# Patient Record
Sex: Female | Born: 1955 | Race: White | Hispanic: No | Marital: Single
Health system: Midwestern US, Academic
[De-identification: ages and names within clinical notes are randomized; demographics above are authoritative.]

---

## 2018-06-30 ENCOUNTER — Encounter: Admit: 2018-06-30 | Discharge: 2018-06-30 | Payer: MEDICARE

## 2018-06-30 DIAGNOSIS — I4891 Unspecified atrial fibrillation: Principal | ICD-10-CM

## 2018-11-17 ENCOUNTER — Encounter: Admit: 2018-11-17 | Discharge: 2018-11-17 | Payer: MEDICARE

## 2018-11-17 NOTE — Progress Notes
RECORDS REQUEST     Please send the following records for continuation of care:     Audrey Aguilar  Jun 12, 1956     Office Notes  EKG's with tracings   Recent Cardiac Testing  Any Cardiac-related records  Recent lab  Cardiac Procedures  H&P/Discharge Summary  Operative Reports- Cardiac     Please fax to:  Desmond Lope [phone]     [facility]  Cardiovascular Medicine

## 2018-11-21 ENCOUNTER — Encounter: Admit: 2018-11-21 | Discharge: 2018-11-21 | Payer: MEDICARE

## 2018-11-21 DIAGNOSIS — I4891 Unspecified atrial fibrillation: ICD-10-CM

## 2018-11-21 DIAGNOSIS — I2510 Atherosclerotic heart disease of native coronary artery without angina pectoris: Principal | ICD-10-CM

## 2018-11-21 DIAGNOSIS — I499 Cardiac arrhythmia, unspecified: ICD-10-CM

## 2018-11-21 DIAGNOSIS — E119 Type 2 diabetes mellitus without complications: ICD-10-CM

## 2018-11-21 DIAGNOSIS — J449 Chronic obstructive pulmonary disease, unspecified: ICD-10-CM

## 2018-11-21 DIAGNOSIS — E785 Hyperlipidemia, unspecified: ICD-10-CM

## 2018-11-21 DIAGNOSIS — I10 Essential (primary) hypertension: ICD-10-CM

## 2018-11-22 NOTE — Progress Notes
Request for the following medical records for purpose of continuity of care:   Has an appointment with RAD on 03/10    Please send most recent OV note and labs.    Please Fax to:  [facility] - [phone]  Dr. Wallene Huh  Attention:  Valentina Gu, RN    Thank you

## 2018-12-15 ENCOUNTER — Encounter: Admit: 2018-12-15 | Discharge: 2018-12-15 | Payer: MEDICARE

## 2018-12-16 ENCOUNTER — Encounter: Admit: 2018-12-16 | Discharge: 2018-12-16 | Payer: MEDICARE

## 2018-12-19 ENCOUNTER — Encounter: Admit: 2018-12-19 | Discharge: 2018-12-19 | Payer: MEDICARE

## 2018-12-20 ENCOUNTER — Encounter: Admit: 2018-12-20 | Discharge: 2018-12-20 | Payer: MEDICARE

## 2018-12-20 DIAGNOSIS — I4891 Unspecified atrial fibrillation: ICD-10-CM

## 2018-12-20 DIAGNOSIS — E785 Hyperlipidemia, unspecified: ICD-10-CM

## 2018-12-20 DIAGNOSIS — I2510 Atherosclerotic heart disease of native coronary artery without angina pectoris: Principal | ICD-10-CM

## 2018-12-20 DIAGNOSIS — I10 Essential (primary) hypertension: ICD-10-CM

## 2018-12-20 DIAGNOSIS — J449 Chronic obstructive pulmonary disease, unspecified: ICD-10-CM

## 2018-12-20 DIAGNOSIS — E119 Type 2 diabetes mellitus without complications: ICD-10-CM

## 2018-12-20 DIAGNOSIS — I499 Cardiac arrhythmia, unspecified: ICD-10-CM

## 2018-12-23 ENCOUNTER — Ambulatory Visit: Admit: 2018-12-23 | Discharge: 2018-12-24 | Payer: MEDICARE

## 2018-12-23 ENCOUNTER — Encounter: Admit: 2018-12-23 | Discharge: 2018-12-23 | Payer: MEDICARE

## 2018-12-23 DIAGNOSIS — E119 Type 2 diabetes mellitus without complications: ICD-10-CM

## 2018-12-23 DIAGNOSIS — E785 Hyperlipidemia, unspecified: ICD-10-CM

## 2018-12-23 DIAGNOSIS — I10 Essential (primary) hypertension: ICD-10-CM

## 2018-12-23 DIAGNOSIS — I2510 Atherosclerotic heart disease of native coronary artery without angina pectoris: Principal | ICD-10-CM

## 2018-12-23 DIAGNOSIS — I499 Cardiac arrhythmia, unspecified: ICD-10-CM

## 2018-12-23 DIAGNOSIS — I4891 Unspecified atrial fibrillation: ICD-10-CM

## 2018-12-23 DIAGNOSIS — J449 Chronic obstructive pulmonary disease, unspecified: ICD-10-CM

## 2018-12-24 DIAGNOSIS — I4891 Unspecified atrial fibrillation: Principal | ICD-10-CM

## 2018-12-24 DIAGNOSIS — I10 Essential (primary) hypertension: ICD-10-CM

## 2018-12-24 DIAGNOSIS — I2510 Atherosclerotic heart disease of native coronary artery without angina pectoris: ICD-10-CM

## 2018-12-24 DIAGNOSIS — R0602 Shortness of breath: ICD-10-CM

## 2019-04-03 ENCOUNTER — Encounter: Admit: 2019-04-03 | Discharge: 2019-04-03

## 2019-04-03 NOTE — Progress Notes
Audrey Aguilar. Woess[place], 1956/05/31, has an appointment with Dr. Arrie Eastern on 04/07/19.    Please send recent lab results for continuity of care.    Thank you,   Jezreel Justiniano    Phone: [phone]  Fax: [phone]

## 2019-04-07 ENCOUNTER — Encounter: Admit: 2019-04-07 | Discharge: 2019-04-07

## 2019-04-07 ENCOUNTER — Ambulatory Visit: Admit: 2019-04-07 | Discharge: 2019-04-07

## 2019-04-07 DIAGNOSIS — I4891 Unspecified atrial fibrillation: Secondary | ICD-10-CM

## 2019-04-07 DIAGNOSIS — I10 Essential (primary) hypertension: Secondary | ICD-10-CM

## 2019-04-07 DIAGNOSIS — I2510 Atherosclerotic heart disease of native coronary artery without angina pectoris: Secondary | ICD-10-CM

## 2019-04-07 DIAGNOSIS — E119 Type 2 diabetes mellitus without complications: Secondary | ICD-10-CM

## 2019-04-07 DIAGNOSIS — I499 Cardiac arrhythmia, unspecified: Secondary | ICD-10-CM

## 2019-04-07 DIAGNOSIS — R0602 Shortness of breath: Secondary | ICD-10-CM

## 2019-04-07 DIAGNOSIS — J449 Chronic obstructive pulmonary disease, unspecified: Secondary | ICD-10-CM

## 2019-04-07 DIAGNOSIS — E785 Hyperlipidemia, unspecified: Secondary | ICD-10-CM

## 2019-04-07 MED ORDER — METOPROLOL SUCCINATE 25 MG PO TB24
25 mg | ORAL_TABLET | Freq: Every day | ORAL | 3 refills | 90.00000 days | Status: DC
Start: 2019-04-07 — End: 2019-04-29

## 2019-04-07 MED ORDER — PERFLUTREN LIPID MICROSPHERES 1.1 MG/ML IV SUSP
1-20 mL | Freq: Once | INTRAVENOUS | 0 refills | Status: CP | PRN
Start: 2019-04-07 — End: ?
  Administered 2019-04-07: 19:00:00 1.5 mL via INTRAVENOUS

## 2019-04-07 NOTE — Telephone Encounter
-----   Message from [place] sent at 04/07/2019  2:32 PM CDT -----  Regarding: Tikosyn Pricing   Tikosyn 500MCG BID  30 Day: $ Prior Auth   90 Day: $ Prior Auth       Dofetilide 500MCG BID   30 Day: $3.60 Retail  90 Day: $3.60 Mail

## 2019-04-10 ENCOUNTER — Encounter: Admit: 2019-04-10 | Discharge: 2019-04-10

## 2019-04-10 NOTE — Progress Notes
8-5 spoke with Audrey Aguilar. Regarding Tikosyn Admit for 8-13 was Approved for 1 inpatient day, Hospital will need to call for any additional days AUTH# [id number] valid from 7-27 to 10-11-2019    7-27 [employer] [phone] regarding Inpatient Tikosyn Admit [id number], [place] per [place] faxed to [phone] with pending AUTH# [id number]    confirmed benefits and eligibility:  Current and active since 09-14-18, $ 0 deductible with required co-insurance of 0% to max OOP $6200, then plan will pay 100% of allowable charges.  Hospital co pay 340

## 2019-04-13 ENCOUNTER — Encounter: Admit: 2019-04-13 | Discharge: 2019-04-13

## 2019-04-20 ENCOUNTER — Encounter: Admit: 2019-04-20 | Discharge: 2019-04-20

## 2019-04-20 NOTE — Telephone Encounter
Audrey Aguilar called and states she is becoming more and more anxious thinking about procedures and being admitted for [place] admission next week  asked if Dr  Arrie Eastern would prescribe something.  Relayed we don't usually do that pre procedure as we want to evaluate/assess prior to admission  she numerous questions including if this has been approved with her [place] Medicare      read note in chart from [place] regarding the admission  concerned about driving to the hospital the morning of admission.  she is worried about traffic.  encouraged her to talk with her support about this to avoid having to drive that morning. (she states she does have someone that could bring her)  Audrey Aguilar said that her anxiety just gets the best of her sometimes as she is a smoker and knows she will not be able to smoke in the hospital    We talked about the above and she did seem better at end of call.  She also plans to follow up with her PCP tomorrow regarding her anxiety

## 2019-04-20 NOTE — Telephone Encounter
-----   Message from Louis Meckel, LPN sent at 11/13/4386  3:46 PM CDT -----  Regarding: RAD- call  VM from patient on triage line.  Michela Pitcher that she needs to talk to [place] about her stay 13 to 15.  Call her at [phone].

## 2019-04-27 ENCOUNTER — Encounter: Admit: 2019-04-27 | Discharge: 2019-04-27

## 2019-04-27 DIAGNOSIS — Z5181 Encounter for therapeutic drug level monitoring: Secondary | ICD-10-CM

## 2019-04-27 DIAGNOSIS — E785 Hyperlipidemia, unspecified: Secondary | ICD-10-CM

## 2019-04-27 DIAGNOSIS — I2510 Atherosclerotic heart disease of native coronary artery without angina pectoris: Secondary | ICD-10-CM

## 2019-04-27 DIAGNOSIS — I499 Cardiac arrhythmia, unspecified: Secondary | ICD-10-CM

## 2019-04-27 DIAGNOSIS — E119 Type 2 diabetes mellitus without complications: Secondary | ICD-10-CM

## 2019-04-27 DIAGNOSIS — I4891 Unspecified atrial fibrillation: Secondary | ICD-10-CM

## 2019-04-27 DIAGNOSIS — I10 Essential (primary) hypertension: Secondary | ICD-10-CM

## 2019-04-27 DIAGNOSIS — J449 Chronic obstructive pulmonary disease, unspecified: Secondary | ICD-10-CM

## 2019-04-27 LAB — BASIC METABOLIC PANEL
Lab: 0.7 mg/dL (ref 0.4–1.00)
Lab: 105 MMOL/L (ref >60)
Lab: 137 MMOL/L (ref 137–147)
Lab: 24 MMOL/L (ref >60)
Lab: 6 mg/dL — ABNORMAL LOW (ref 7–25)
Lab: 8 pg (ref 3–12)
Lab: 82 mg/dL (ref 70–100)
Lab: 9.3 mg/dL (ref 8.5–10.6)
Lab: >60 mL/min (ref >60)
Lab: >60 mL/min (ref >60)

## 2019-04-27 LAB — MAGNESIUM: Lab: 2 mg/dL (ref 1.6–2.6)

## 2019-04-27 LAB — CBC: Lab: 6.3 K/UL (ref 4.5–11.0)

## 2019-04-27 MED ORDER — POTASSIUM CHLORIDE 20 MEQ PO TBTQ
20 meq | Freq: Once | ORAL | 0 refills | Status: CP
Start: 2019-04-27 — End: ?
  Administered 2019-04-28: 02:00:00 20 meq via ORAL

## 2019-04-27 MED ORDER — GABAPENTIN 300 MG PO CAP
300 mg | Freq: Two times a day (BID) | ORAL | 0 refills | Status: DC
Start: 2019-04-27 — End: 2019-04-29
  Administered 2019-04-28 – 2019-04-29 (×4): 300 mg via ORAL

## 2019-04-27 MED ORDER — LOSARTAN 50 MG PO TAB
100 mg | Freq: Every day | ORAL | 0 refills | Status: DC
Start: 2019-04-27 — End: 2019-04-29
  Administered 2019-04-28 – 2019-04-29 (×2): 100 mg via ORAL

## 2019-04-27 MED ORDER — POTASSIUM CHLORIDE 20 MEQ PO TBTQ
40 meq | Freq: Once | ORAL | 0 refills | Status: CP
Start: 2019-04-27 — End: ?
  Administered 2019-04-27: 21:00:00 40 meq via ORAL

## 2019-04-27 MED ORDER — METOPROLOL SUCCINATE 25 MG PO TB24
25 mg | Freq: Every day | ORAL | 0 refills | Status: DC
Start: 2019-04-27 — End: 2019-04-28
  Administered 2019-04-28: 15:00:00 25 mg via ORAL

## 2019-04-27 MED ORDER — HELP MEDICATION
Freq: Every day | 0 refills | Status: DC
Start: 2019-04-27 — End: 2019-04-27

## 2019-04-27 MED ORDER — AMLODIPINE 5 MG PO TAB
5 mg | Freq: Every day | ORAL | 0 refills | Status: DC
Start: 2019-04-27 — End: 2019-04-29
  Administered 2019-04-28 – 2019-04-29 (×2): 5 mg via ORAL

## 2019-04-27 MED ORDER — ALBUTEROL SULFATE 90 MCG/ACTUATION IN HFAA
2 | RESPIRATORY_TRACT | 0 refills | Status: DC | PRN
Start: 2019-04-27 — End: 2019-04-29
  Administered 2019-04-28: 02:00:00 2 via RESPIRATORY_TRACT

## 2019-04-27 MED ORDER — ROSUVASTATIN 5 MG PO TAB
5 mg | Freq: Every day | ORAL | 0 refills | Status: DC
Start: 2019-04-27 — End: 2019-04-29
  Administered 2019-04-28 – 2019-04-29 (×2): 5 mg via ORAL

## 2019-04-27 MED ORDER — APIXABAN 5 MG PO TAB
5 mg | Freq: Two times a day (BID) | ORAL | 0 refills | Status: DC
Start: 2019-04-27 — End: 2019-04-29
  Administered 2019-04-28 – 2019-04-29 (×4): 5 mg via ORAL

## 2019-04-27 MED ORDER — DOFETILIDE 500 MCG PO CAP
500 ug | ORAL | 0 refills | Status: DC
Start: 2019-04-27 — End: 2019-04-29
  Administered 2019-04-27 – 2019-04-29 (×4): 500 ug via ORAL

## 2019-04-27 MED ORDER — FUROSEMIDE 20 MG PO TAB
20 mg | Freq: Every day | ORAL | 0 refills | Status: DC
Start: 2019-04-27 — End: 2019-04-29
  Administered 2019-04-27 – 2019-04-29 (×3): 20 mg via ORAL

## 2019-04-27 MED ORDER — CLONAZEPAM 1 MG PO TAB
1 mg | Freq: Every evening | ORAL | 0 refills | Status: DC
Start: 2019-04-27 — End: 2019-04-29
  Administered 2019-04-28 – 2019-04-29 (×2): 1 mg via ORAL

## 2019-04-27 MED ORDER — TIOTROPIUM BROMIDE 18 MCG IN CPDV
1 | Freq: Every day | RESPIRATORY_TRACT | 0 refills | Status: DC
Start: 2019-04-27 — End: 2019-04-28

## 2019-04-27 MED ORDER — ALBUTEROL SULFATE 2.5 MG /3 ML (0.083 %) IN NEBU
2.5 mg | RESPIRATORY_TRACT | 0 refills | Status: DC | PRN
Start: 2019-04-27 — End: 2019-04-27

## 2019-04-27 NOTE — Consults
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 She is admitted for Tikosyn loading.    Assessment:  Follows with Dr. Beverlee Nims from [facility] for general cardiology  Consult visit with Dr. Wallene Huh on 04/07/2019    #Persistent Atrial Fibrillation  - Prior AAD: Refractory to Sotalol  - OAC: Eliquis > no missed doses in the last 30 days  - CHA2DS2-VASc Score = 3 (CAD, HTN, female)  - About one year ago developed symptoms of shortness of breath and fatigue and was in AF   - Had DCCV and was back in AF that night   - Planned ablation at OSH cancelled due to insurance issues and continued tobacco abuse  - During clinic visit in July, 2020 with Dr. Wallene Huh was in AF in 50's and sotolol discontinued. Added metoprolol XL 25 mg  - Patient wants to try another AAD prior to ablation    #CAD  - 2011 IWMI s/p 2 stents to LCX and PTCA of LAD    #Current tobacco use / COPD   - smokes about 1.5 ppd.  - PTA daily inhalers    #ASD s/p open repair in 1974  #HTN  #HLD  #Sciatica  #Obesity with BMI - 30.18  #Tobacco Abuse - active    Recommendations:     Regarding: Tikosyn Pricing   Tikosyn BID  30 Day: $ Prior Auth   90 Day: $ Prior Auth   ???  Dofetilide BID   30 Day: $3.60 Retail  90 Day: $3.60 Mail    **NOTE** no longer providing one week supply of dofetilide at discharge. Dofetilide prescription should be sent to [facility] pharmacy at discharge.    Labs reviewed. Replace K+ with K-Dur PO x 1 dose. EKG shows AF controlled VR, QTC = .      Start dofetilide 500 mcg po every 10 hours. Desired time to take at discharge: 8am/8pm. If patient does not convert by tomorrow afternoon she will have DCCV.    NPO after midnight tonight.    We will monitor ECG 2 hours after each dose of Tikosyn x 5 total doses.  Aim to keep Mg+ > 2.0 and K+ > 4.0 (will replace as needed).  Continuous telemetry.  Please avoid all QT prolonging medications.  Will follow along with you.    If ECG needs to be reviewed after hours, please page: [facility] - Night Float thru [place].  Please only call if next dose of antiarrhythmic is due, otherwise ECG will be reviewed in the morning.      PATIENT IS REQUIRED TO BE FULL CODE DURING DRUG LOADING PERIOD.  PLEASE ADDRESS CODE STATUS IF NECESSARY.    Levonne Hubert, APRN-C  Pager [phone] / Amie Critchley  EP Pager [phone]        Medicare attestation  _____________________________________________________________________________    History of Present Illness:  Audrey Aguilar is a 63 y.o. female patient with a longstanding history of AF which was successfully treated for sotolol for years without any  symptoms. One year ago she started having shortness of breath and fatigue related to AF. A DCCV was done and she was back in AF that night. She presents today for initiation of dofetilide for rhythm control.      [name] had planned to have an ablation at an OSH about one year ago but it fell through due to insurance issues and patient continued to smoke cigarettes.    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      Past Medical History:  Medical History:   Diagnosis Date   ??? Arrhythmia    ??? Atrial fibrillation (HCC)    ??? CAD (coronary artery disease)    ??? COPD (chronic obstructive pulmonary disease) (HCC)    ??? Diabetes (HCC)    ??? HTN (hypertension) ??? Hyperlipemia      Social History:  Social History     Socioeconomic History   ??? Marital status: Single     Spouse name: Not on file   ??? Number of children: Not on file   ??? Years of education: Not on file   ??? Highest education level: Not on file   Occupational History   ??? Not on file   Tobacco Use   ??? Smoking status: Current Every Day Smoker     Packs/day: 1.50     Types: Cigarettes   ??? Smokeless tobacco: Never Used   Substance and Sexual Activity   ??? Alcohol use: No   ??? Drug use: No   ??? Sexual activity: Not on file   Other Topics Concern   ??? Not on file   Social History Narrative   ??? Not on file       Surgical History:  Surgical History:   Procedure Laterality Date   ??? ATRIAL SEPTAL DEFECT REPAIR      1970   ??? CARDIAC CATHERIZATION      with stents 2011   ??? CARDIOVERSION      2011       Family History:  Family History   Problem Relation Age of Onset   ??? Arrhythmia Mother    ??? Heart Attack Father    ??? Stroke Father    ??? Diabetes Sister    ??? Heart Attack Sister    ??? Coronary Artery Disease Sister    ??? Diabetes Brother    ??? Diabetes Brother        Medications:        Allergies:  Allergies   Allergen Reactions   ??? Bupropion RASH       Review of Systems:  Const: denies recent fever, chills, or weight loss  Eyes:  denies changes in vision  Cardiovascular:  denies chest pain or palpitations  Respiratory: denies dyspnea, orthopnea, PND or productive cough    Gastrointestinal:  denies N/V/D  Genitourinary:  denies dysuria or hematuria  Musculoskeletal:  denies muscle aches or pains  Skin: denies known rashes, lesions or sores  Neurologic:  denies dizziness, lightheadedness, syncope, or near-syncope  Heme: denies recent melena or hematochezia                           Vital Signs: Most Recent                 Vital Signs: 24 Hour Range   BP: 119/85 (  08/13 1309)  Temp: 36.6 ???C (97.8 ???F) (08/13 1309)  Pulse: 67 (08/13 1309)  Respirations: 18 PER MINUTE (08/13 1309)  SpO2: 95 % (08/13 1309) Height: 157.5 cm (5' 2) (08/13 1309) BP: (119)/(85)   Temp:  [36.6 ???C (97.8 ???F)]   Pulse:  [67]   Respirations:  [18 PER MINUTE]   SpO2:  [95 %]      Vitals:    04/27/19 1309   Weight: 74 kg (163 lb 3.2 oz)     No intake or output data in the 24 hours ending 04/27/19 1531     Physical Exam:  GEN: well appearing 63 y.o. female who appears stated age and is no acute distress  HEENT: unremarkable  LUNGS: Diminished breath sounds throughout, more so bibasilarly  HEART: irregularly irregular rhythm, without murmur or gallop appreciated  EXTR: no C/C/E   SKIN: warm and dry  NEUR: A&Ox3  PSYCH: calm and cooperative    Labs:  Hematology:    Lab Results   Component Value Date    HGB 14.2 04/27/2019    HCT 41.8 04/27/2019    PLTCT 270 04/27/2019    WBC 6.3 04/27/2019    MCV 91.8 04/27/2019    MCHC 33.9 04/27/2019    MPV 7.8 04/27/2019    RDW 13.7 04/27/2019   , Coagulation:  No results found for: PT, PTT, INR and General Chemistry:    Lab Results   Component Value Date    NA 137 04/27/2019    K 3.9 04/27/2019    CL 105 04/27/2019    GAP 8 04/27/2019    BUN 6 04/27/2019    CR 0.74 04/27/2019    GLU 82 04/27/2019    CA 9.3 04/27/2019    MG 2.0 04/27/2019

## 2019-04-27 NOTE — Progress Notes
QT monitoring update note    QTc of 414ms at 59 BPM. QRSd 117ms. Atrial fibrillation with isolated PVC.     Previous QTc noted to be 462ms.     OK to proceed with current drug loading protocol.

## 2019-04-28 ENCOUNTER — Encounter: Admit: 2019-04-28 | Discharge: 2019-04-28

## 2019-04-28 DIAGNOSIS — I10 Essential (primary) hypertension: Secondary | ICD-10-CM

## 2019-04-28 DIAGNOSIS — I4891 Unspecified atrial fibrillation: Principal | ICD-10-CM

## 2019-04-28 DIAGNOSIS — E119 Type 2 diabetes mellitus without complications: Secondary | ICD-10-CM

## 2019-04-28 DIAGNOSIS — E785 Hyperlipidemia, unspecified: Secondary | ICD-10-CM

## 2019-04-28 DIAGNOSIS — J449 Chronic obstructive pulmonary disease, unspecified: Secondary | ICD-10-CM

## 2019-04-28 DIAGNOSIS — I499 Cardiac arrhythmia, unspecified: Secondary | ICD-10-CM

## 2019-04-28 DIAGNOSIS — I2510 Atherosclerotic heart disease of native coronary artery without angina pectoris: Secondary | ICD-10-CM

## 2019-04-28 LAB — BASIC METABOLIC PANEL: Lab: 140 MMOL/L — ABNORMAL LOW (ref >60)

## 2019-04-28 LAB — COVID-19 (SARS-COV-2) PCR

## 2019-04-28 LAB — PHOSPHORUS: Lab: 3.4 mg/dL (ref 2.0–4.5)

## 2019-04-28 LAB — MAGNESIUM: Lab: 2 mg/dL — ABNORMAL LOW (ref 1.6–2.6)

## 2019-04-28 LAB — CBC: Lab: 6.4 K/UL — ABNORMAL LOW (ref <150)

## 2019-04-28 MED ORDER — TIOTROPIUM BROMIDE 18 MCG IN CPDV
1 | Freq: Every day | RESPIRATORY_TRACT | 0 refills | Status: DC
Start: 2019-04-28 — End: 2019-04-29
  Administered 2019-04-28: 11:00:00 1 via RESPIRATORY_TRACT

## 2019-04-28 MED ORDER — NICOTINE (POLACRILEX) 4 MG BU LOZG
4 mg | ORAL | 0 refills | Status: DC | PRN
Start: 2019-04-28 — End: 2019-04-29

## 2019-04-28 MED ORDER — SODIUM CHLORIDE 0.9 % IV SOLP
INTRAVENOUS | 0 refills | Status: DC
Start: 2019-04-28 — End: 2019-04-29
  Administered 2019-04-28: 21:00:00 1000.000 mL via INTRAVENOUS

## 2019-04-28 MED ORDER — NICOTINE 21 MG/24 HR TD PT24
1 | Freq: Every day | TRANSDERMAL | 0 refills | Status: DC
Start: 2019-04-28 — End: 2019-04-28

## 2019-04-28 MED ORDER — HYDROXYZINE HCL 25 MG PO TAB
50 mg | ORAL | 0 refills | Status: DC | PRN
Start: 2019-04-28 — End: 2019-04-29
  Administered 2019-04-28 – 2019-04-29 (×2): 50 mg via ORAL

## 2019-04-28 MED ORDER — MELATONIN 3 MG PO TAB
3 mg | Freq: Every evening | ORAL | 0 refills | Status: DC
Start: 2019-04-28 — End: 2019-04-29
  Administered 2019-04-29: 02:00:00 3 mg via ORAL

## 2019-04-28 MED ORDER — PATCH DOCUMENTATION - NICOTINE 14 MG/24HR
Freq: Two times a day (BID) | TRANSDERMAL | 0 refills | Status: DC
Start: 2019-04-28 — End: 2019-04-29

## 2019-04-28 MED ORDER — NICOTINE 14 MG/24 HR TD PT24
1 | Freq: Every day | TRANSDERMAL | 0 refills | Status: DC
Start: 2019-04-28 — End: 2019-04-29

## 2019-04-28 MED ORDER — NICOTINE 21 MG/24 HR TD PT24
1 | Freq: Every day | TRANSDERMAL | 0 refills | Status: DC
Start: 2019-04-28 — End: 2019-04-29
  Administered 2019-04-28 – 2019-04-29 (×2): 1 via TRANSDERMAL

## 2019-04-28 MED ORDER — PROPOFOL INJ 10 MG/ML IV VIAL
0 refills | Status: DC
Start: 2019-04-28 — End: 2019-04-28
  Administered 2019-04-28: 22:00:00 40 mg via INTRAVENOUS

## 2019-04-28 MED ORDER — PATCH DOCUMENTATION - NICOTINE 21 MG/24HR
Freq: Two times a day (BID) | TRANSDERMAL | 0 refills | Status: DC
Start: 2019-04-28 — End: 2019-04-28

## 2019-04-28 MED ORDER — PATCH DOCUMENTATION - NICOTINE 21 MG/24HR
Freq: Two times a day (BID) | TRANSDERMAL | 0 refills | Status: DC
Start: 2019-04-28 — End: 2019-04-29

## 2019-04-28 NOTE — Progress Notes
Pre-Operative Assessment for TEE or Cardioversion    Date of Service:  04/28/2019    Audrey Aguilar is a 63 y.o. y.o. female. With significant CAD, HTN, Afib, ASD repair, COPD, who is referred for Cardioversion Indication: Afib .      she has been compliant with her  apixaban (Eliquis) Missed dose: Noand no bleeding. she is Positive for: Hx. Chest surgery/radiation and Positive for: COPD / Asthma and Smoker.      GI procedures:None            When: No    Chest pain:  No   SOB: No         Medical History:  Medical History:   Diagnosis Date   ??? Arrhythmia    ??? Atrial fibrillation (HCC)    ??? CAD (coronary artery disease)    ??? COPD (chronic obstructive pulmonary disease) (HCC)    ??? Diabetes (HCC)    ??? HTN (hypertension)    ??? Hyperlipemia         Surgical History:   Surgical History:   Procedure Laterality Date   ??? ATRIAL SEPTAL DEFECT REPAIR      1970   ??? CARDIAC CATHERIZATION      with stents 2011   ??? CARDIOVERSION      2011       Social History     Social History     Tobacco Use   ??? Smoking status: Current Every Day Smoker     Packs/day: 1.50     Types: Cigarettes   ??? Smokeless tobacco: Never Used   Substance Use Topics   ??? Alcohol use: No   ??? Drug use: No         Allergies                                        Allergies   Allergen Reactions   ??? Bupropion RASH          Current Medications  No current facility-administered medications on file prior to encounter.      Current Outpatient Medications on File Prior to Encounter   Medication Sig Dispense Refill   ??? acetaminophen (TYLENOL) 500 mg tablet Take 500 mg by mouth daily as needed for Pain. Max of 4,000 mg of acetaminophen in 24 hours.     ??? albuterol 0.083% (PROVENTIL) 2.5 mg /3 mL (0.083 %) nebulizer solution Inhale 2.5 mg by mouth into the lungs every 4-6 hours as needed.     ??? amLODIPine (NORVASC) 5 mg tablet Take 5 mg by mouth daily.     ??? apixaban (ELIQUIS) 5 mg tablet Take 5 mg by mouth twice daily. ??? blood sugar diagnostic (DARIO BLOOD GLUCOSE TEST STRIP MISC) 1 each.     ??? blood-glucose meter kit by In Vitro route     ??? cholecalciferol(+) (VITAMIN D-3) 2,000 unit tablet Take 2,000 Units by mouth daily.     ??? clonazePAM (KLONOPIN) 1 mg tablet Take 1 tablet by mouth daily.     ??? furosemide (LASIX) 20 mg tablet Take 20 mg by mouth daily.     ??? gabapentin (NEURONTIN) 300 mg capsule Take 300 mg by mouth twice daily.     ??? Lancing Device with Lancets kit by NOT APPLICABLE route.     ??? losartan(+) (COZAAR) 100 mg tablet Take 100 mg by mouth daily.     ???  metoprolol XL (TOPROL XL) 25 mg extended release tablet Take one tablet by mouth daily. 90 tablet 3   ??? potassium chloride (K-DUR) 10 mEq tablet Take 10 mEq by mouth daily.     ??? rosuvastatin (CRESTOR) 5 mg tablet Take 5 mg by mouth daily.     ??? tiotropium bromide (SPIRIVA RESPIMAT) 2.5 mcg/actuation inhaler Take 2 puffs by mouth daily.     ??? traMADoL (ULTRAM) 50 mg tablet Take 50 mg by mouth every 8 hours as needed for Pain.         Vitals  Estimated body mass index is 29.85 kg/m??? as calculated from the following:    Height as of this encounter: 1.575 m (5' 2).    Weight as of this encounter: 74 kg (163 lb 3.2 oz).       Patient appears alert and oriented: Yes  NPO: for greater than 8 hours  Inpatient IV status: 20g Left AC    Diagnostic Tests  White Blood Cells   Date Value Ref Range Status   04/28/2019 6.4 4.5 - 11.0 K/UL Final     Hemoglobin   Date Value Ref Range Status   04/28/2019 13.9 12.0 - 15.0 GM/DL Final     Hematocrit   Date Value Ref Range Status   04/28/2019 41.3 36 - 45 % Final     Platelet Count   Date Value Ref Range Status   04/28/2019 239 150 - 400 K/UL Final     Sodium   Date Value Ref Range Status   04/28/2019 140 137 - 147 MMOL/L Final     Potassium   Date Value Ref Range Status   04/28/2019 4.0 3.5 - 5.1 MMOL/L Final     Magnesium   Date Value Ref Range Status   04/28/2019 2.0 1.6 - 2.6 mg/dL Final     Blood Urea Nitrogen Date Value Ref Range Status   04/28/2019 12 7 - 25 MG/DL Final     Creatinine   Date Value Ref Range Status   04/28/2019 0.66 0.4 - 1.00 MG/DL Final     Glucose   Date Value Ref Range Status   04/28/2019 89 70 - 100 MG/DL Final       Last MAC INR Flow Sheet Entry:    Last recorded Lab results:   No results found for: INR  No results found for: PTT        Blood Cultures     Microbiology - Resulted Micro Last 72 Hrs      COVID-19 (SARS-COV-2) PCR  Resulted: 04/28/19 0653, Result status: Final result   Ordering provider:  Huel Coventry, MD  04/27/19 1419 Resulting lab:  [place] [facility]    Specimen Information    Source Collected On   Nasopharyngeal Swab 04/27/19 1523          Components    Component Value Flag   COVID-19 (SARS-CoV-2) PCR Source NASOPHARYNGEAL SWAB  ???   COVID-19 (SARS-CoV-2) PCR NOT DETECTED  ???                Last TEE date: None  Last Cardioversion date: None  Echo procedures within the past 30 days:  No results found.      Device Information on File  No results found for: GENERATOR, EPDEVTYP      Additional Comments:  None    Plan:  Dr. Duane Lope will plan to proceed with the  Cardioversion.

## 2019-04-28 NOTE — Progress Notes
2000 Patient reporting high levels of stress and anxiety with being in the hospital and having to start Tikosyn. Patient left unit around 2000 without notifying staff. Upon her return patient requesting to leave AMA. Stating that "this was a mistake" in coming to the hospital and that she no longer wanted to take Tikosyn. Patient stated that being the hospital was causing her too much anxiety and that she needed to leave.     Patient provided with food and drink, breathing treatment offered by RT and patient given night time clonazepam. Patient given a few minutes to calm down and is now agreeable to stay in the hospital and continue plan of care.     2100 patient left unit again without letting staff know. Unable to find patient. MD Reda updated. Informed MD about Tikosyn policy and the need for continues tele monitoring of Tikosyn start patients. MD to follow up with patient when she returns to floor.     2130 Patient returned to floor. Educated patient on the importance of staying on the floor while she is being started on Tikosyn and safety risks this may cause if she continues to leave the floor. Patient stated understanding and said she would try her best, but " makes no promises." MD Reda updated.     Will continue to monitor.

## 2019-04-28 NOTE — Progress Notes
Patient agreeable to abide by our monitoring requirements prior to administration of 3rd tikoysn dose. Patient verbalized understanding and denies additional questions or concerns at this time. Again discussed ways to relieve anxiety while remaining on the unit. PRN anxiety medication administered as appropriate. Nicotine patch in place. Patient given crossword puzzles and other word games. Patient instructed that she is able to walk around on the unit as much as she likes.     Patient currently remains NPO for DCCV this afternoon. Patient agreeable.

## 2019-04-28 NOTE — Care Plan
Problem: Discharge Planning  Goal: Participation in plan of care  Outcome: Goal Ongoing  Goal: Knowledge regarding plan of care  Outcome: Goal Ongoing  Goal: Prepared for discharge  Outcome: Goal Ongoing

## 2019-04-28 NOTE — Care Plan
[facility] CONSULTATION    ASSESSMENT/RECOMMENDATIONS  Patient was referred for [facility] consultation  Tobacco Use Treatment Practical Counseling was provided in hospital (including recognizing danger situations, developing coping skills and providing basic information about quitting).         This tobacco treatment counseling and education consult was completed by phone due to [place] [facility] social isolation protocols.     MEDICATION RECOMMENDATIONS TO QUIT TOBACCO:  In-patient quit-tobacco medication: If medically acceptable, please provide Nicotine patches (35 mg) and 4 mg nicotine lozenges, which is recommended dose for her level of smoking  Discharge medication options: If medically acceptable, please provide DC Rx for  Nicotrol Inhaler. Pt's insurance does not cover nicotine patches. Pt plans to talk with doctor to see if it is medically acceptable to use Chantix. She used it successfully in the past.     Post discharge support referral: Accepted State Tobacco Quitline    [employer] Educational Material: Accepted  Materials will be mailed    History of Present Illness   Reports using 30 cigarettes per day.  Reports using tobacco within 5 minutes minutes of waking.  E-Cigarette or vape use: Never  Other tobacco use: None   Years used: 48  Withdrawal: Mild nicotine withdrawal based upon the patients rating on the Nicotine Withdrawal Behavior Rating Scale.   Patient does not live with other smokers or vapers  Plan about smoking after patient leaves the hospital: I plan to try to quit when I leave the hospital  Interest in quitting: High   Set a quit date: No    Contact Information    If I can be of further assistance, please call [facility] [phone]

## 2019-04-28 NOTE — Care Coordination-Inpatient
Please page Med Private O- 2nd Round [phone] before 8AM and page  med private D  after 8AM.

## 2019-04-28 NOTE — Progress Notes
Electrophysiology Progress Note    Admission Date: 04/27/2019  Today's Date: 04/28/2019  LOS: 1 day    Assessment & Plan   Audrey Aguilar is a 63 y.o. 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 She is admitted for Tikosyn loading.  ???  Assessment:  Follows with Dr. Beverlee Nims from [facility] for general cardiology  Consult visit with Dr. Wallene Huh on 04/07/2019  ???  #Persistent Atrial Fibrillation  - Prior AAD: Refractory to Sotalol  - OAC: Eliquis > no missed doses in the last 30 days  - CHA2DS2-VASc Score = 3 (CAD, HTN, female)  - About one year ago developed symptoms of shortness of breath and fatigue and was in AF               - Had DCCV and was back in AF that night               - Planned ablation at OSH cancelled due to insurance issues and continued tobacco abuse  - During clinic visit in July, 2020 with Dr. Wallene Huh was in AF in 50's and sotolol discontinued. Added metoprolol XL 25 mg  - Patient wants to try another AAD prior to ablation  ???  #CAD  - 2011 IWMI s/p 2 stents to LCX and PTCA of LAD  ???  #Current tobacco use / COPD   - smokes about 1.5 ppd.  - PTA daily inhalers  ???  #ASD s/p open repair in 1974  #HTN  #HLD  #Sciatica  #Obesity with BMI - 30.18  #Tobacco Abuse - active    Telemetry    ECG  Baseline QTc:  QTc after 1st dose:  QTc after 2nd dose:  QTc after 3rd dose:  QTc after 4th dose:  QTc after 5th dose:  ???  Recommendations:     Regarding: Tikosyn Pricing   Tikosyn BID  30 Day: $ Prior Auth   90 Day: $ Prior Auth   ???  Dofetilide BID   30 Day: $3.60 Retail  90 Day: $3.60 Mail  ???  **NOTE** no longer providing one week supply of dofetilide at discharge. Dofetilide prescription should be sent to [facility] pharmacy at discharge.    Labs reviewed/stable. No replacement necessary.    Notes from nursing regarding patient anxiety and leaving floor w/out informing staff on two occasions. Long discussion with patient reviewing the rationale for continued monitoring while on this medication and that she cannot leave the floor. If she would like to stop drug load and go home, then we can discuss this. However, I want her to consider her options and let me know if she plans to stay. I need her to agree to abide by our monitoring requirements. She remains NPO for DCCV later this afternoon.    RN requested to discuss addition of PRN med for anxiety.    Nicotine patch, 21mg , ordered.    Will continue to monitor ECG 2 hours after each dose for a total of 5 monitored doses.  Continuous telemetry.  Keep Mg+ > 2.0 and K+ > 4.0.  Please avoid all QT prolonging medications.    If ECG needs  to be read prior to next dose, please page: Cardiology Night Fellow thru On-Call.  Only page if next dose is due to be given, otherwise ECG will be read in the am.      Medicare attestation       Subjective:  Very anxious. Doesn't understand why we can monitor her from home, but can't monitor her here off the floor.       Medications  Scheduled Meds:amLODIPine (NORVASC) tablet 5 mg, 5 mg, Oral, QDAY  apixaban (ELIQUIS) tablet 5 mg, 5 mg, Oral, BID  clonazePAM (KlonoPIN) tablet 1 mg, 1 mg, Oral, QHS  dofetilide (TIKOSYN) capsule 500 mcg, 500 mcg, Oral, Q10H*  furosemide (LASIX) tablet 20 mg, 20 mg, Oral, QDAY  gabapentin (NEURONTIN) capsule 300 mg, 300 mg, Oral, BID  INHALATIONAL SPACING DEVICE MISC SPCR (Cabinet Override), , , NOW  losartan (COZAAR) tablet 100 mg, 100 mg, Oral, QDAY  metoprolol XL (TOPROL XL) tablet 25 mg, 25 mg, Oral, QDAY  rosuvastatin (CRESTOR) tablet 5 mg, 5 mg, Oral, QDAY  tiotropium (SPIRIVA) capsule for inhaler 1 capsule, 1 capsule, Inhalation, QDAY    Continuous Infusions: PRN and Respiratory Meds:albuterol sulfate Q4H PRN      Objective                       Vital Signs: Last Filed                 Vital Signs: 24 Hour Range   BP: 128/67 (08/14 0725)  Temp: 36.6 ???C (97.9 ???F) (08/14 0725)  Pulse: 62 (08/14 0725)  Respirations: 16 PER MINUTE (08/14 0725)  SpO2: 97 % (08/14 0725)  Height: 157.5 cm (5' 2) (08/13 1309) BP: (119-142)/(59-85)   Temp:  [36.3 ???C (97.3 ???F)-36.6 ???C (97.9 ???F)]   Pulse:  [62-94]   Respirations:  [16 PER MINUTE-18 PER MINUTE]   SpO2:  [92 %-97 %]      Vitals:    04/27/19 1309   Weight: 74 kg (163 lb 3.2 oz)         Intake/Output Summary:  (Last 24 hours)    Intake/Output Summary (Last 24 hours) at 04/28/2019 0847  Last data filed at 04/28/2019 0415  Gross per 24 hour   Intake 1500 ml   Output 1900 ml   Net -400 ml           Body mass index is 29.85 kg/m???.    Physical Exam        GEN: no acute distress  CHEST: diminished throughout  HEART: irregularly irregular, nml rate; nml S1 & S2, no S3 or S4; no rub; no murmurs  EXT: no c/c/e  NEURO: A&Ox3  SKIN: warm and dry      Lab Review  Hematology:    Lab Results   Component Value Date    HGB 13.9 04/28/2019    HCT 41.3 04/28/2019    PLTCT 239 04/28/2019    WBC 6.4 04/28/2019    MCV 92.2 04/28/2019    MCHC 33.7 04/28/2019    MPV 8.4 04/28/2019    RDW 13.8 04/28/2019   , Coagulation:  No results found for: PT, PTT, INR and General Chemistry:    Lab Results   Component Value Date    NA 140 04/28/2019    K 4.0 04/28/2019    CL 107 04/28/2019    GAP 11 04/28/2019    BUN 12 04/28/2019    CR 0.66 04/28/2019    GLU 89  04/28/2019    CA 8.9 04/28/2019    MG 2.0 04/28/2019               Wyvonnia Lora, APRN-NP (pgr [phone])

## 2019-04-28 NOTE — Progress Notes
EP Note:      QTc post dose 3 Tikosyn ranging 463-545ms.  Post cardioversion, patient noted to be in SB 51bpm. QT right at 568ms.    Discontinue metoprolol.    Continue Tikosyn at 564mcg. Would wait to give next dose of Tikosyn closer to midnight. If QT remains stable as beta-blockade is clearing system, can give next dose 10 hours after ~ 10AM. Final EKG ~ noon and discharge if stable.    Dr. Karle Starch is on this weekend, with EP Fellow Dr. Danny Lawless.    Please review EKG this evening with CV night fellow.    Caroline More, APRN-C  Pager [phone] / Hetty Ely  EP Pager [phone]

## 2019-04-28 NOTE — Anesthesia Post-Procedure Evaluation
Post-Anesthesia Evaluation    Name: Audrey Aguilar      MRN: [id number]     DOB: 20-Nov-1955     Age: 63 y.o.     Sex: female   __________________________________________________________________________     Procedure Information     Anesthesia Start Date/Time:  04/28/19 1627    Scheduled providers:  Clance Boll, MD    Procedure:  CARDIOVERSION, EXTERNAL    Location:  [facility]  BP: 103/63 [phone] 1657)  Pulse: 59 (08/14 1657)  Respirations: 16 PER MINUTE (08/14 1657)  SpO2: 94 % (08/14 1657)   Vitals Value Taken Time   BP 103/63 04/28/2019  4:57 PM   Temp     Pulse 59 04/28/2019  4:57 PM   Respirations 16 PER MINUTE 04/28/2019  4:57 PM   SpO2 94 % 04/28/2019  4:57 PM         Post Anesthesia Evaluation Note    Evaluation location: pre/post  Patient participation: recovered; patient participated in evaluation  Level of consciousness: alert    Pain score: 0  Pain management: adequate    Hydration: normovolemia  Temperature: 36.0C - 38.4C  Airway patency: adequate    Perioperative Events       Post-op nausea and vomiting: no PONV    Postoperative Status  Cardiovascular status: hemodynamically stable  Respiratory status: spontaneous ventilation  Follow-up needed: none        Perioperative Events  Perioperative Event: No  Emergency Case Activation: No

## 2019-04-28 NOTE — Case Management (ED)
Case Management Admission Assessment    NAME:Audrey Aguilar                          MRN: [id number]             DOB:12-10-1955          AGE: 63 y.o.  ADMISSION DATE: 04/27/2019             DAYS ADMITTED: LOS: 1 day      Today???s Date: 04/28/2019    Source of Information: patient  This CM met with pt for assessment on this date.  Provided contact information and explanation of SW/NCM roles.  Reviewed [facility], Preparing for Discharge, and Preferred Provider Network hand-outs.  Provided opportunity for questions and discussion. Pt/family encouraged to contact Case Management team with questions and concerns during hospitalization and until patient is able to transition back to the patient's primary care physician.         Plan  Plan: Case Management Assessment, Discharge Planning for Home Anticipated  Plan DC home, no CM needs currently assessed.  Pt drove herself.  Copay check completed prior to admission.  No CM needs currently assessed. CM team continue to follow and assess additional CM needs.    Patient Address/Phone  [address], [address]  [place] [address]  [phone] (home)     Emergency Contact  Extended Emergency Contact Information  Primary Emergency Contact: GATES,EARL  Mobile Phone: [phone]  Relation: Relative  Secondary Emergency Contact: Burcher,Bobby   [place]  Home Phone: [phone]  Relation: Son    Forensic scientist: No, patient does not have a healthcare directive  Would patient like to fill out a (a new) Editor, commissioning?: No, patient declined  Psych Advance Directive (Psych unit only): No, patient does not have a Social research officer, government  Does the patient need discharge transport arranged?: No  Transportation Name, Phone and Availability #1: patient drove herself  Does the patient use Medicaid Transportation?: No    Expected Discharge Date  Expected Discharge Date: 04/29/19    Living Situation Prior to Admission ? Living Arrangements  Type of Residence: Home, independent  Living Arrangements: Alone  How many levels in the residence?: 1  Can patient live on one level if needed?: Yes  Does residence have entry and/or side stairs?: Yes(4 steps to enter)  Assistance needed prior to admit or anticipated on discharge: No  Who provides assistance or could if needed?: family  ? Level of Function      ? Cognitive Abilities   Cognitive Abilities: Alert and Oriented, Participates in decision making    Financial Resources  ? Coverage  Primary Insurance: Medicare Replacement(Aetna Medicare)  Secondary Insurance: Medicaid([place] medicaid)  Additional Coverage: RX(Fills at [facility], Rx are affordable.)    ? Source of Income      ? Financial Assistance Needed?  none    Psychosocial Needs  ? Mental Health  Mental Health History: In the past  Mental Health Provider: Pt is not currently seeing mental health provider  ? Substance Use History     ? Other  no    Current/Previous Services  ? PCP  Lona Kettle, [phone], [phone]  ? Pharmacy    [facility] [address] [place] [address] [place] [address] [place] [address]  [place] [address]  Phone: [phone] Fax: [phone]    ? Durable Medical  Equipment   Durable Medical Equipment at home: None  ? Home Health     ? Hemodialysis or Peritoneal Dialysis  Undergoing hemodialysis or peritoneal dialysis: No  ? Tube/Enteral Feeds  Receive tube/enteral feeds: No  ? Infusion  Receive infusions: No  ? Private Duty  Private duty help used: No  ? Home and Community Based Services  Home and community based services: No  ? [employer] Supply: N/A  ? Hospice  Hospice: No  ? Outpatient Therapy  PT: No  OT: No  SLP: No  ? Skilled Nursing Facility/Nursing Home  SNF: No  NH: No  ? Inpatient Rehab  IPR: No  ? Long-Term Acute Care Hospital  LTACH: No  ? Acute Hospital Stay  Acute Hospital Stay: In the past  Was patient's stay within the last 30 days?: No(2017 at [facility]) Ellamae Sia RN BSN  Integrated Nurse Case Manager  [phone]/ [phone]/ Voalte [phone]

## 2019-04-28 NOTE — Progress Notes
Pre-Operative Assessment for TEE or Cardioversion    Date of Service:  04/28/2019  Audrey Aguilar is a 63 y.o. y.o. female.     DOB: Jan 28, 1956                  MRN#:  [id number]      History and Physical Exam    I reviewed H&P from 04/27/2019 .  No changes noted.    Assessment    I reviewed:    nurse pre-procedure assessment (including past medical history, allergies, medications, labs)    NPO status Acceptable  I assessed the patient's airways and noted:    Physical Status Classification (American Society of Anesthesiologists):   ASA II (A normal patient with mild systemic disease)    Sedation/Medication Plan    Sedation plan per Anesthesiology

## 2019-04-28 NOTE — Patient Education
Pharmacy Tikosyn Education     SHANECA ORNE was educated on [place]. Education included obtaining the medication following discharge, potential side effects, drug interactions, how to take the medication, importance of telling all healthcare professionals about Tikosyn use, and answering of any additional patient questions. Ms. Meisinger was provided a Tikosyn information packet and demonstrated understanding of the information presented. Ms. Witter was instructed to contact pharmacy if any further questions should arise.     Additional Comments:  Isolation patient - education was completed over the phone.    [place] Intern  04/27/2019

## 2019-04-28 NOTE — Progress Notes
Patient arrived to room # [phone]*) via ambulation accompanied by transport. Patient transferred to the bed without assistance. Bedside safety checks completed. Initial patient assessment completed. Refer to flowsheet for details.    Admission skin assessment completed with: Daphene Jaeger, RN    Pressure injury present on arrival?: No    1. Head/Face/Neck: No  2. Trunk/Back: No  3. Upper Extremities: No  4. Lower Extremities: No  5. Pelvic/Coccyx: No  6. Assessed for device associated injury? Yes  7. Malnutrition Screening Tool (Nursing Nutrition Assessment) Completed? Yes    See Doc Flowsheet for additional wound details.

## 2019-04-29 ENCOUNTER — Encounter: Admit: 2019-04-28 | Discharge: 2019-04-28

## 2019-04-29 ENCOUNTER — Encounter
Admit: 2019-04-27 | Discharge: 2019-04-29 | Disposition: A | Discharge status: Home / Self Care | Source: Ambulatory Visit

## 2019-04-29 ENCOUNTER — Ambulatory Visit: Admit: 2019-04-27 | Discharge: 2019-04-27

## 2019-04-29 ENCOUNTER — Encounter: Admit: 2019-04-29 | Discharge: 2019-04-29

## 2019-04-29 DIAGNOSIS — E669 Obesity, unspecified: Secondary | ICD-10-CM

## 2019-04-29 DIAGNOSIS — Z79899 Other long term (current) drug therapy: Secondary | ICD-10-CM

## 2019-04-29 DIAGNOSIS — I4819 Other persistent atrial fibrillation: Secondary | ICD-10-CM

## 2019-04-29 DIAGNOSIS — Z8774 Personal history of (corrected) congenital malformations of heart and circulatory system: Secondary | ICD-10-CM

## 2019-04-29 DIAGNOSIS — I493 Ventricular premature depolarization: Secondary | ICD-10-CM

## 2019-04-29 DIAGNOSIS — F17210 Nicotine dependence, cigarettes, uncomplicated: Secondary | ICD-10-CM

## 2019-04-29 DIAGNOSIS — M5430 Sciatica, unspecified side: Secondary | ICD-10-CM

## 2019-04-29 DIAGNOSIS — Z6829 Body mass index (BMI) 29.0-29.9, adult: Secondary | ICD-10-CM

## 2019-04-29 DIAGNOSIS — Z1159 Encounter for screening for other viral diseases: Secondary | ICD-10-CM

## 2019-04-29 DIAGNOSIS — J449 Chronic obstructive pulmonary disease, unspecified: Secondary | ICD-10-CM

## 2019-04-29 DIAGNOSIS — I10 Essential (primary) hypertension: Secondary | ICD-10-CM

## 2019-04-29 DIAGNOSIS — Z9861 Coronary angioplasty status: Secondary | ICD-10-CM

## 2019-04-29 DIAGNOSIS — G4700 Insomnia, unspecified: Secondary | ICD-10-CM

## 2019-04-29 DIAGNOSIS — E785 Hyperlipidemia, unspecified: Secondary | ICD-10-CM

## 2019-04-29 DIAGNOSIS — F411 Generalized anxiety disorder: Secondary | ICD-10-CM

## 2019-04-29 DIAGNOSIS — E119 Type 2 diabetes mellitus without complications: Secondary | ICD-10-CM

## 2019-04-29 LAB — PHOSPHORUS: Lab: 4 mg/dL — ABNORMAL LOW (ref 2.0–4.5)

## 2019-04-29 LAB — MAGNESIUM: Lab: 2 mg/dL — ABNORMAL LOW (ref 1.6–2.6)

## 2019-04-29 LAB — BASIC METABOLIC PANEL: Lab: 139 MMOL/L — ABNORMAL LOW (ref 137–147)

## 2019-04-29 MED ORDER — DOFETILIDE 500 MCG PO CAP
500 ug | ORAL_CAPSULE | Freq: Two times a day (BID) | ORAL | 1 refills | Status: DC
Start: 2019-04-29 — End: 2019-04-29

## 2019-04-29 MED ORDER — DOFETILIDE 250 MCG PO CAP
250 ug | Freq: Two times a day (BID) | ORAL | 0 refills | Status: DC
Start: 2019-04-29 — End: 2019-04-29
  Administered 2019-04-29: 18:00:00 250 ug via ORAL

## 2019-04-29 MED ORDER — NICOTINE 10 MG IN CRTG
1 | RESPIRATORY_TRACT | 0 refills | Status: AC | PRN
Start: 2019-04-29 — End: ?
  Filled 2019-04-29: qty 168, 10d supply, fill #1

## 2019-04-29 MED ORDER — DOFETILIDE 250 MCG PO CAP
250 ug | ORAL_CAPSULE | Freq: Two times a day (BID) | ORAL | 3 refills | Status: AC
Start: 2019-04-29 — End: ?
  Filled 2019-04-29: qty 60, 30d supply
  Filled 2019-04-29: qty 60, 30d supply, fill #1

## 2019-04-29 NOTE — Progress Notes
[place] discharged on 04/29/2019.  Equipment Removed: Telepack.  Discharge instructions reviewed with Patient who stated understanding of all instructions given including discharge medications and follow up needs. .  Valuables returned:   Personal Items / Valuables: Valuables/Belongings home with patient.  Home medications:    .  Functional assessment at discharge complete: Yes .

## 2019-04-29 NOTE — Progress Notes
Heart Failure Nursing Progress Note    Admission Date: 04/27/2019  LOS: 2 days    Admission Weight: 74 kg (163 lb 3.2 oz)        Most recent weights (inpatient):   Vitals:    04/27/19 1309 04/29/19 0614   Weight: 74 kg (163 lb 3.2 oz) 72.5 kg (159 lb 12.8 oz)     Weight change from previous day: -1.5 kg    Fluid restriction ordered: N/A    Intake/Output Summary: (Last 24 hours)    Intake/Output Summary (Last 24 hours) at 04/29/2019 4401  Last data filed at 04/29/2019 0205  Gross per 24 hour   Intake 400 ml   Output 1125 ml   Net -725 ml       Is patient incontinent No    Anticipated discharge date: 04/29/19  Discharge goals: 5th Tikosyn dose with stable QTC      Daily Assessment of Patient Stated Goals:    Short Term Goal Identified by patient (Short Term=during hospitalization):  Finish starting my new medication.

## 2019-04-29 NOTE — Progress Notes
Discharge Day Note    SUBJECTIVE:  Patient is doing well. No acute events overnight. Tolerating Tikosyn well. No new concerns.    ROS: No CP, SOA, cough, abd pain, N/V, dysuria, hematuria, fevers, chills or night sweats.    OBJECTIVE:  Physical exam: Patient is A&O, no distress, neck is supple, heart and lung sounds clear, abdomen soft and non-tender, extremities without edema, no neurologic deficits.    VS and labs reviewed. Electrolytes within range. HR in the 60s this morning.    Meds reviewed.    Audrey Aguilar, Audrey Aguilar   Home Medication Instructions [id number]    Printed on:04/29/19 8119   Medication Information                      acetaminophen (TYLENOL) 500 mg tablet  Take 500 mg by mouth daily as needed for Pain. Max of 4,000 mg of acetaminophen in 24 hours.             albuterol 0.083% (PROVENTIL) 2.5 mg /3 mL (0.083 %) nebulizer solution  Inhale 2.5 mg by mouth into the lungs every 4-6 hours as needed.             amLODIPine (NORVASC) 5 mg tablet  Take 5 mg by mouth daily.             apixaban (ELIQUIS) 5 mg tablet  Take 5 mg by mouth twice daily.             blood sugar diagnostic (DARIO BLOOD GLUCOSE TEST STRIP MISC)  1 each.             blood-glucose meter kit  by In Vitro route             cholecalciferol(+) (VITAMIN D-3) 2,000 unit tablet  Take 2,000 Units by mouth daily.             clonazePAM (KLONOPIN) 1 mg tablet  Take 1 tablet by mouth daily.             dofetilide (TIKOSYN) 500 mcg capsule  Take one capsule by mouth twice daily.             furosemide (LASIX) 20 mg tablet  Take 20 mg by mouth daily.             gabapentin (NEURONTIN) 300 mg capsule  Take 300 mg by mouth twice daily.             Lancing Device with Lancets kit  by NOT APPLICABLE route.             losartan(+) (COZAAR) 100 mg tablet  Take 100 mg by mouth daily.             nicotine(+) (NICOTROL) 10 mg inhaler  Inhale one puff by mouth into the lungs as Needed. Puff by mouth as needed. May use 6-16 cartridges per day as needed for up to 6 months.             potassium chloride (K-DUR) 10 mEq tablet  Take 10 mEq by mouth daily.             rosuvastatin (CRESTOR) 5 mg tablet  Take 5 mg by mouth daily.             tiotropium bromide (SPIRIVA RESPIMAT) 2.5 mcg/actuation inhaler  Take 2 puffs by mouth daily.                    Imaging.  IMPRESSION:  Active Problems:    Atrial fibrillation (HCC)      PLAN:  DC home today after her 5th dose. Follow up with cardiology requested.    Plan was discussed in length with the patient. All questions were answered to her best satisfaction.    I spent more than 30 minutes evaluating the patient, reviewing medical chart, directing medication management, reviewing vitals, labs, radiology results and coordinating discharge. More than 50% of the time was spent in face-to-face contact with the patient at bedside.    Melvyn Novas, MD  Clinical Assistant Professor - Internal Medicine Dept  Med Private D- [phone]

## 2019-04-29 NOTE — Progress Notes
CARDIAC ELECTROPHYSIOLOGY  PROGRESS NOTE       Impression:     Audrey Aguilar is a 63 y.o. female with a history of atrial fibrillation who follows with Dr. Wallene Huh.    # Persistent atrial fibrillation  Meds: prior sotalol  # Coronary artery disease status post PCI  # Tobacco abuse and history of COPD  # Hypertension    She has received 4 doses of dofetilide at 500 mcg twice daily.  She has been on every 10 hour dosing.  She underwent DC cardioversion which was successful on 04/28/2019.  Her QTC was borderline after cardioversion with HR 51 bpm.  She received another dose of 500 mcg of dofetilide in the QTc remains borderline.  I think it would be best to go and reduce the dose to 250 mcg twice daily and changed to every 12 hours dosing.  We have stopped her beta-blocker (last dose of Toprol XL on 04/28/19 at 1002) as she is having some bradycardia which would also make dofetilide more potent.  We may elect to keep her for another 24 hours of monitoring to make sure her QT remains acceptable for discharge.    Dose #1 -- QT/QTc (04/27/19 at 1613, 500 mcg):   Dose #2 -- QT/QTc (04/28/19 at 0217, 500 mcg):   Dose #3 -- QT/QTc (04/28/19 at 1215, 500 mcg): during AF -- after DCCV 502/463 (SR 51 bpm)  Dose #4 -- QT/QTc (04/28/19 at 2358, 500 mcg): 497/497 ms (some PACs and NS SVT on ECG)  Dose #5 -- QT/QTc (04/29/19 at PENDIN, 250 mcg): PENDING     Recommendations:     -Reduce dofetilide to 250 mcg twice daily  -May monitor for another 24 hours depending on next ECG  -Discontinue Toprol XL  -Continue apixaban 5 mg twice daily    Thank you for the consult. We will continue to follow.    Priscille Loveless, MD, [facility], [place], [id number]  04/29/2019  11:02 AM     Subjective/Objective:     Subjective:  No acute events overnight.  Reports less shortness of breath, no longer having chest pain, and feeling better after cardioversion yesterday.    Objective:   BP 119/73 (BP Source: Arm, Left Lower)  - Pulse 75  - Temp 36.7 ???C (98 ???F) - Ht 1.575 m (5' 2)  - Wt 72.5 kg (159 lb 12.8 oz)  - SpO2 94%  - BMI 29.23 kg/m???   GENERAL APPEARANCE: Patient in no apparent distress.  PSYCH: Alert and oriented  VESSELS: JVD is not elevated above the sternal notch.  ENT: Moist mucous membranes  CARDIOVASCULAR: No parasternal lift Regular rhythm and normal rate. S1 and S2 present.  +3/6 systolic murmur RUSB.  There is no pericardial rub  GI: Abdomen  Soft, nontender, nondistended  .     RESPIRATORY: Prolonged expiratory phase bilaterally without wheezing  EXTREMITIES: There was no    lower extremity edema   SKIN: No rash on chest    Inpatient Medications:    Current Facility-Administered Medications:   ???  albuterol sulfate (PROAIR HFA) inhaler 2 puff, 2 puff, Inhalation, Q4H PRN, Huel Coventry, MD, 2 puff at 04/29/19 0540  ???  amLODIPine (NORVASC) tablet 5 mg, 5 mg, Oral, QDAY, Huel Coventry, MD, 5 mg at 04/29/19 0847  ???  apixaban (ELIQUIS) tablet 5 mg, 5 mg, Oral, BID, Huel Coventry, MD, 5 mg at 04/29/19 0847  ???  clonazePAM (KlonoPIN) tablet 1 mg, 1 mg, Oral, QHS, Huel Coventry,  MD, 1 mg at 04/28/19 2128  ???  dofetilide (TIKOSYN) capsule 250 mcg, 250 mcg, Oral, Q12H*, Janalyn Rouse, MD  ???  furosemide (LASIX) tablet 20 mg, 20 mg, Oral, QDAY, Huel Coventry, MD, 20 mg at 04/29/19 0846  ???  gabapentin (NEURONTIN) capsule 300 mg, 300 mg, Oral, BID, Huel Coventry, MD, 300 mg at 04/29/19 0846  ???  hydrOXYzine (ATARAX) tablet 50 mg, 50 mg, Oral, Q6H PRN, Rayetta Humphrey, MD, 50 mg at 04/28/19 2128  ???  losartan (COZAAR) tablet 100 mg, 100 mg, Oral, QDAY, Huel Coventry, MD, 100 mg at 04/29/19 0846  ???  melatonin tablet 3 mg, 3 mg, Oral, QHS, Tepe, Lavone Orn, MD, 3 mg at 04/28/19 2128  ???  nicotine (NICODERM CQ STEP 1) 21 mg/day patch 1 patch, 1 patch, Transdermal, QDAY, Stopped at 04/29/19 0848 **AND** Verification of Patch Placement and Integrity - Nicotine 21 MG/24HR, , Transdermal, BID, Thompson, Barbara J, APRN-NP  ???  nicotine (NICODERM CQ STEP 2) 14 mg/day patch 1 patch, 1 patch, Transdermal, QDAY **AND** Verification of Patch Placement and Integrity - Nicotine 14 MG/24HR, , Transdermal, BID, Tepe, Lavone Orn, MD  ???  nicotine polacrilex (COMMIT) lozenge 4 mg, 4 mg, Oral, Q1H PRN, Lelan Pons, Lavone Orn, MD  ???  rosuvastatin (CRESTOR) tablet 5 mg, 5 mg, Oral, QDAY, Huel Coventry, MD, 5 mg at 04/28/19 2358  ???  sodium chloride 0.9 %   infusion, , Intravenous, Continuous, Wyvonnia Lora, APRN-NP, Stopped at 04/28/19 1644  ???  tiotropium (SPIRIVA) capsule for inhaler 1 capsule, 1 capsule, Inhalation, Dorthula Perfect, Huel Coventry, MD, 1 capsule at 04/29/19 0541    Pertinent Lab Results: Reviewed laboratory results as of today    Pertinent Studies:   ECG demonstrates SR as per above       Telemetry: Telemetry as of 04/29/2019 shows sinus rhythm in the upper 50s and lower 60s with rare atrial ectopy.

## 2019-04-29 NOTE — Progress Notes
EP cardiology fellow short progress note    - reviewed ECG post tikosyn dose #5 (reduced dose of 250 mcg) which revealed QT/QTc of 478/ 478 (HR ~60 bpm).    - Okay for patient to be discharged on lowered dose of 250 mcg.   - Dicussed with Dr. Karle Starch, on call EP attending.  - Relayed information to bedside nurse via phone.     Kelin Nixon A. Ouida Sills, DO  Cardiac Electrophysiology Fellow, PGY-7  Contact via [facility] or Pager #: [phone]  4:00 PM, 04/29/2019

## 2019-04-29 NOTE — Care Plan
Patient denied questions/concerns after reviewing POC. Will continue to reinforce where appropriate.     Problem: Discharge Planning  Goal: Participation in plan of care  Outcome: Goal Ongoing  Goal: Knowledge regarding plan of care  Outcome: Goal Ongoing  Goal: Prepared for discharge  Outcome: Goal Ongoing

## 2019-12-25 IMAGING — RF FL guided spine inject
1 series · 6 of 6 positions shown · non-contrast
Comparison: none

[Series 1: run · 3 acquisitions, 6 frames shown]
[im 1/3]
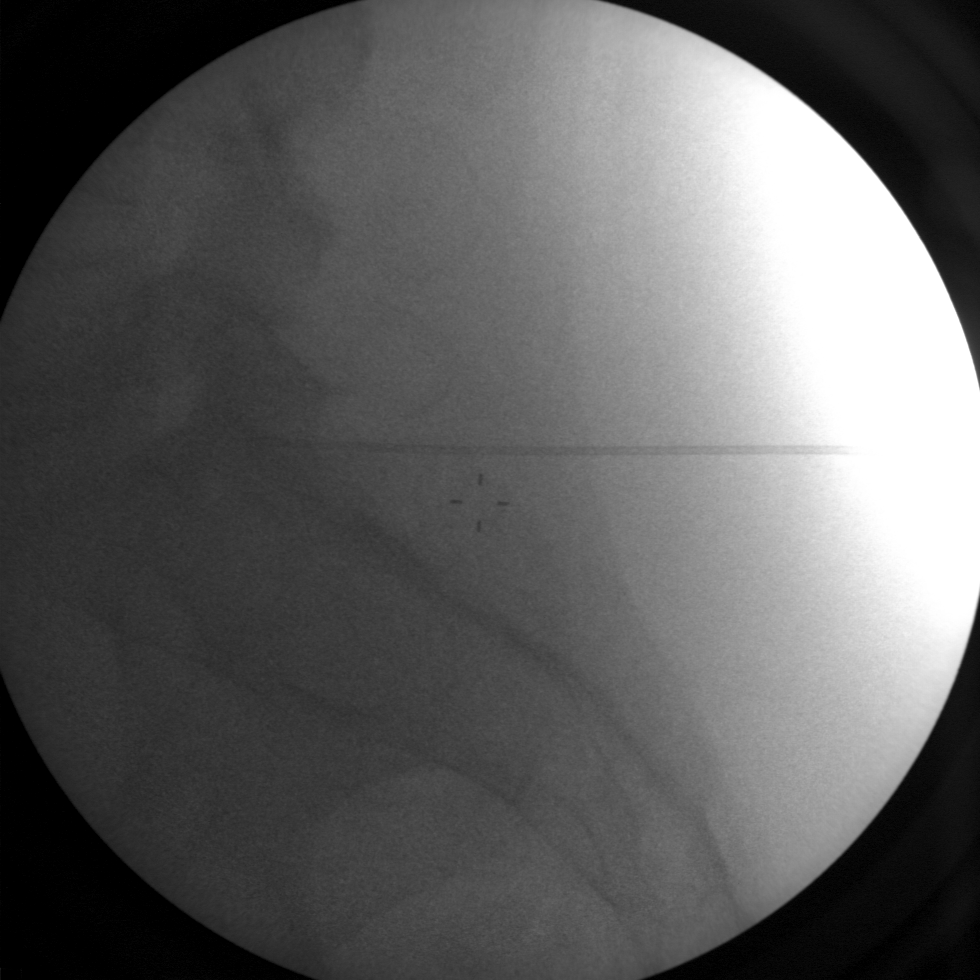
[im 2/3]
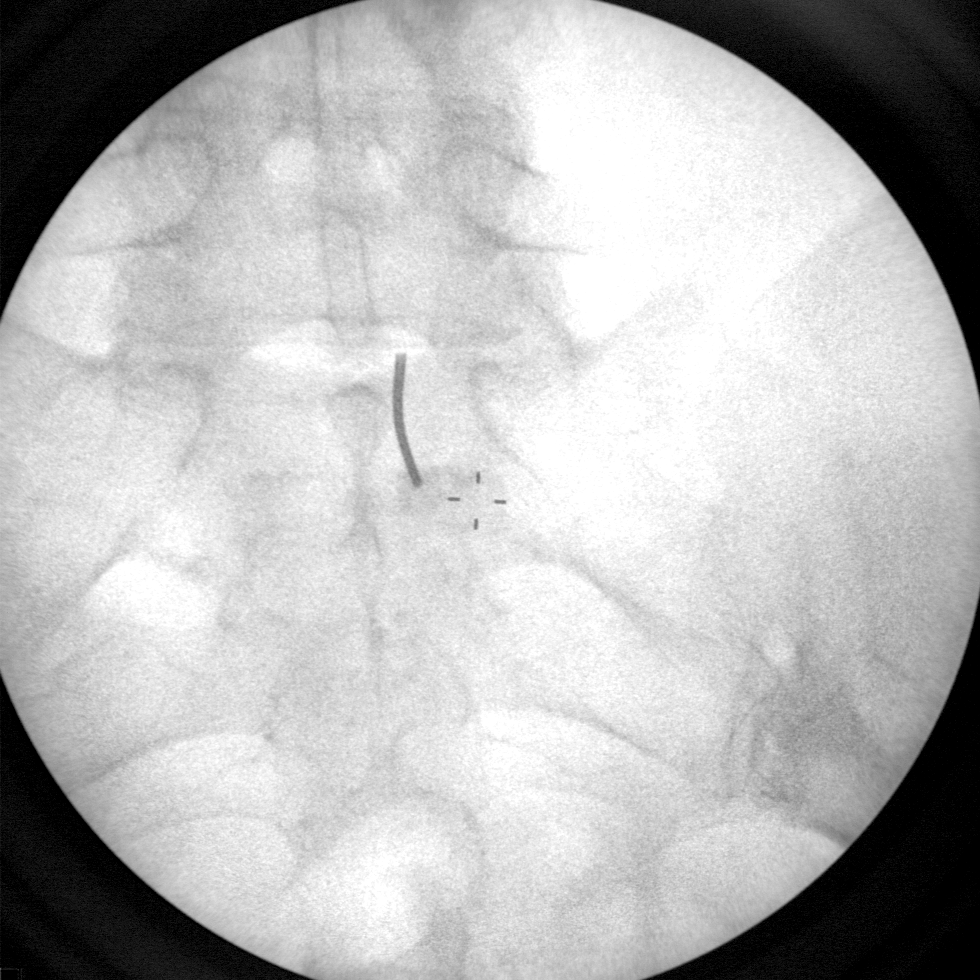
[im 3/3]
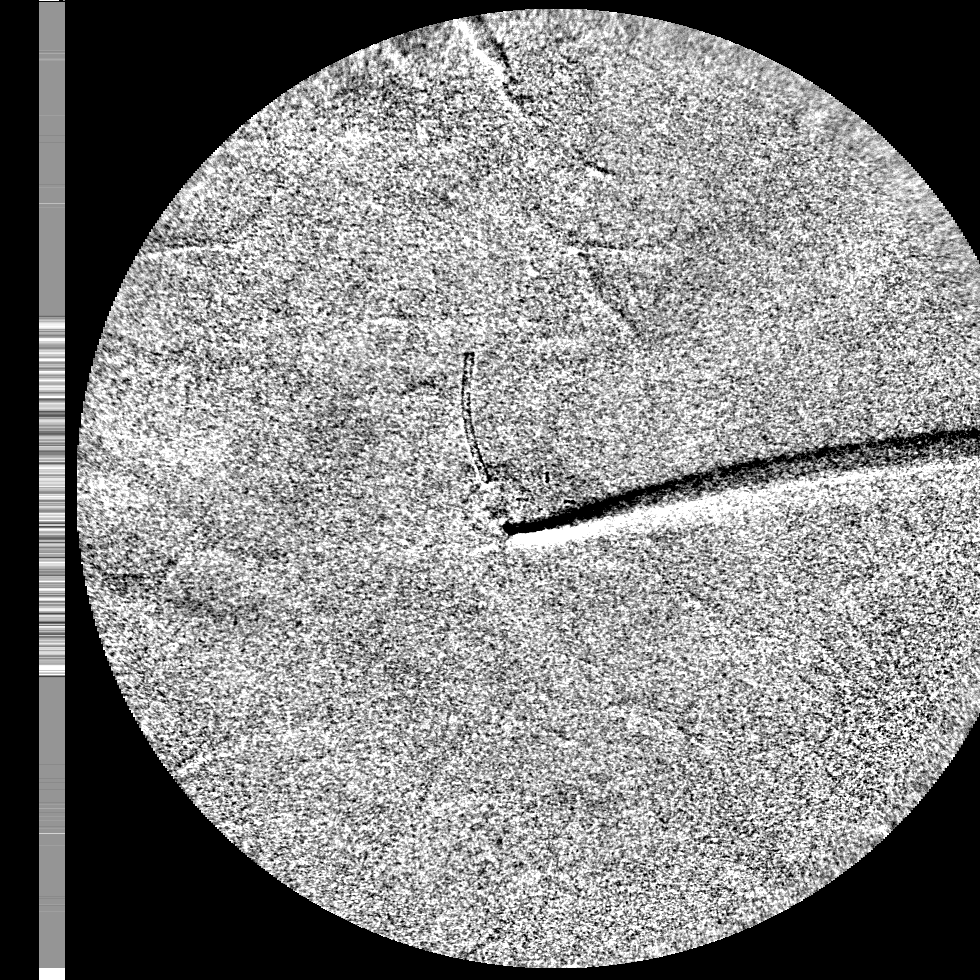
[im 3/3]
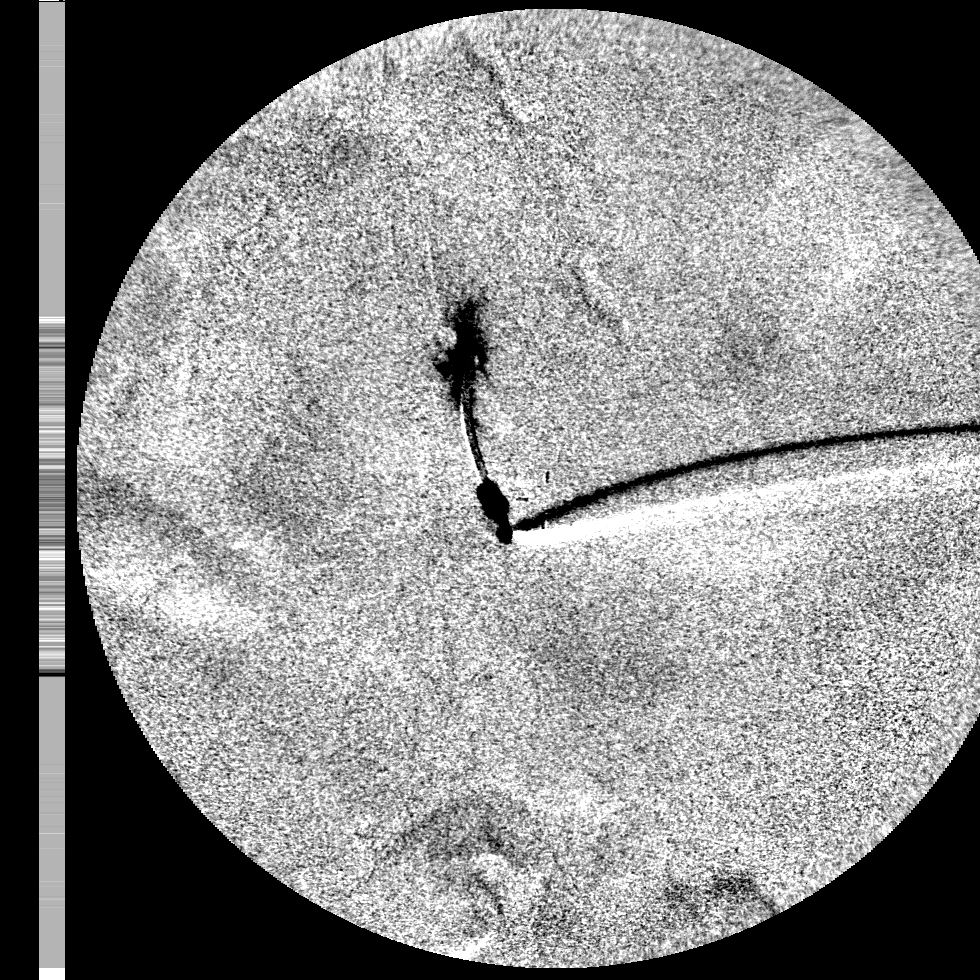
[im 3/3]
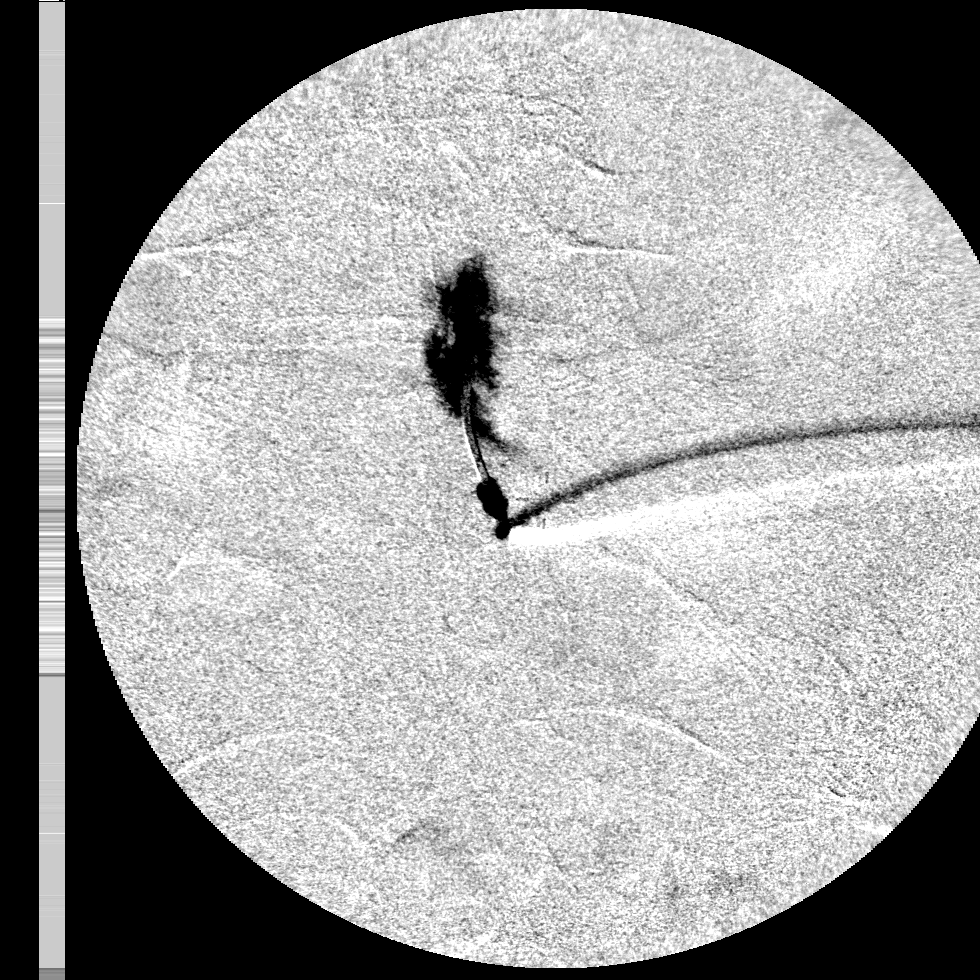
[im 3/3]
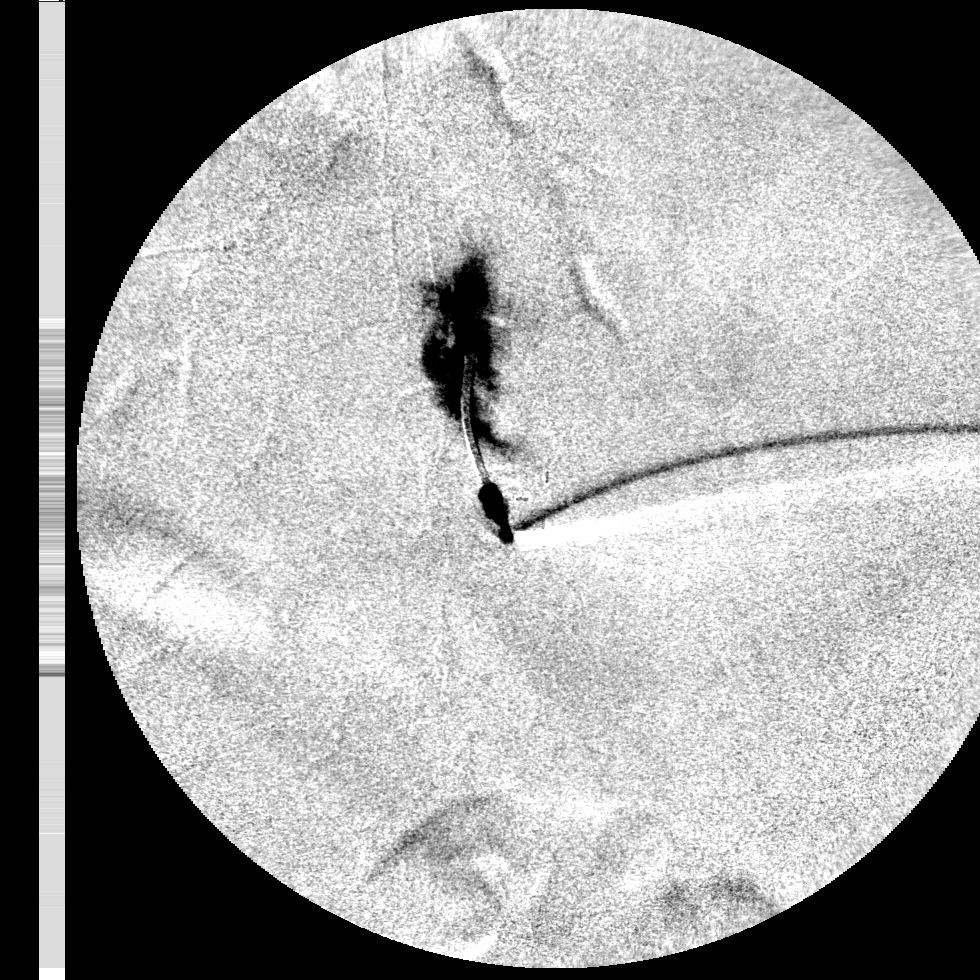

[6 of 6 positions shown; findings below may reference images not displayed]

EXAM

FL guided spine inject

INDICATION

Fluoroscopically guided injection

TECHNIQUE

Total fluoroscopic time 16.1 seconds

A total of 41 images were obtained

Multiple fluoroscopic intraoperative guidance for needle placement was utilized

COMPARISONS

MR lumbar spine 11/14/2018

FINDINGS

Successful intraoperative fluoroscopic guidance for spinal needle placement.

IMPRESSION
- Successful intraoperative fluoroscopic guidance for spinal needle placement.

Tech Notes:

STYLES. 41 images. Total fluoro time: 16.1 sec. Dose: 4.43 mgy. ME

## 2023-02-02 ENCOUNTER — Encounter: Admit: 2023-02-02 | Discharge: 2023-02-02 | Payer: MEDICARE

## 2023-02-02 DIAGNOSIS — I4891 Unspecified atrial fibrillation: Secondary | ICD-10-CM

## 2023-02-22 IMAGING — MG MM mammogram 3D screen bilat
8 series · 10 of 24 positions shown · non-contrast
Comparison: none

[R CC]
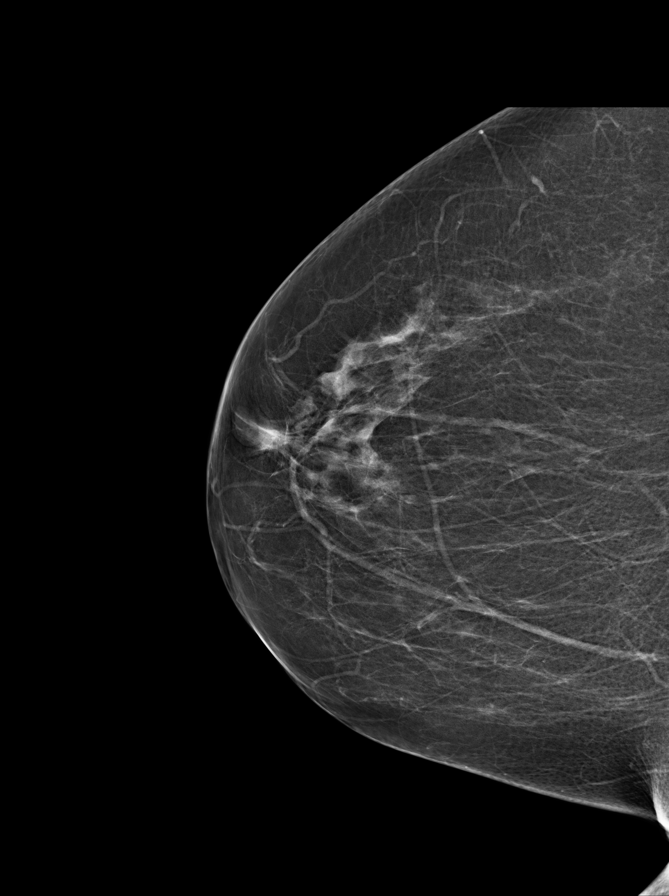

[Series 4: BTO_TOMO R-CC PRIME, EMPIRE_C.97/112, Sc tomo · 3 of 57 frames shown]
[frame 19/57]
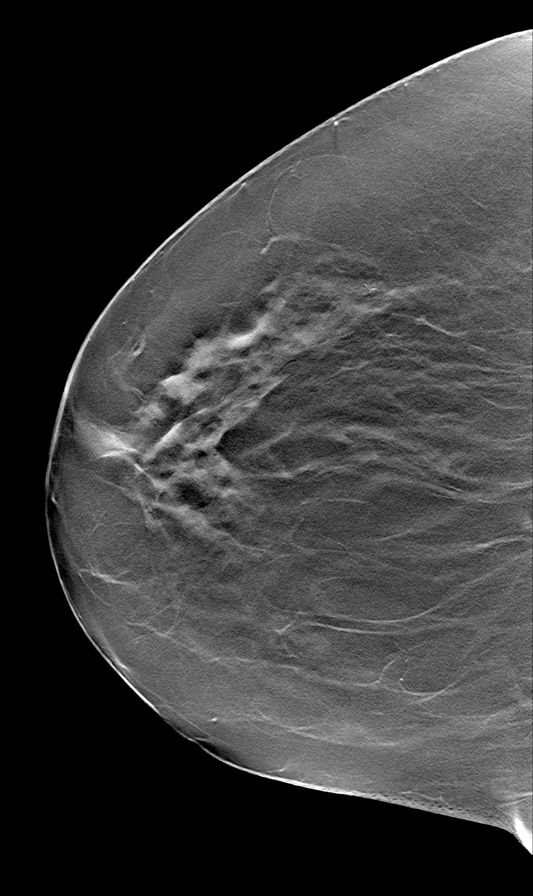
[frame 29/57]
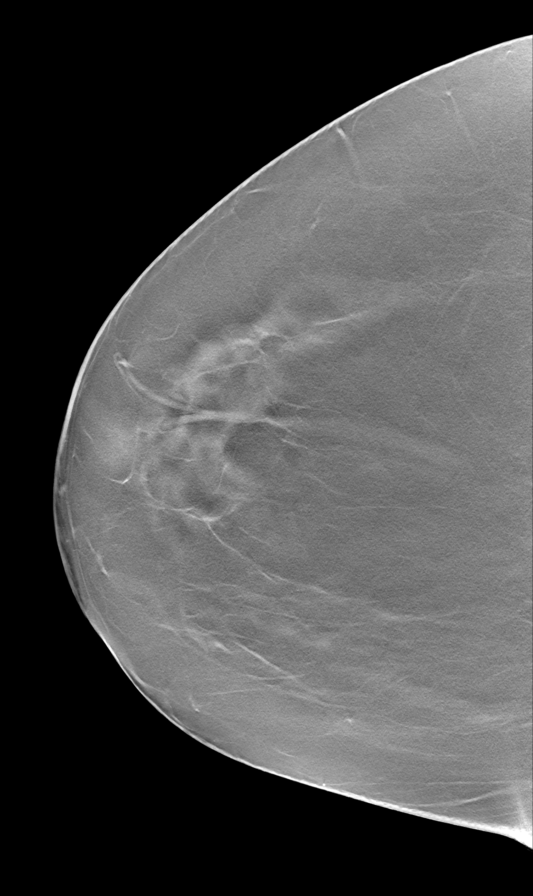
[frame 39/57]
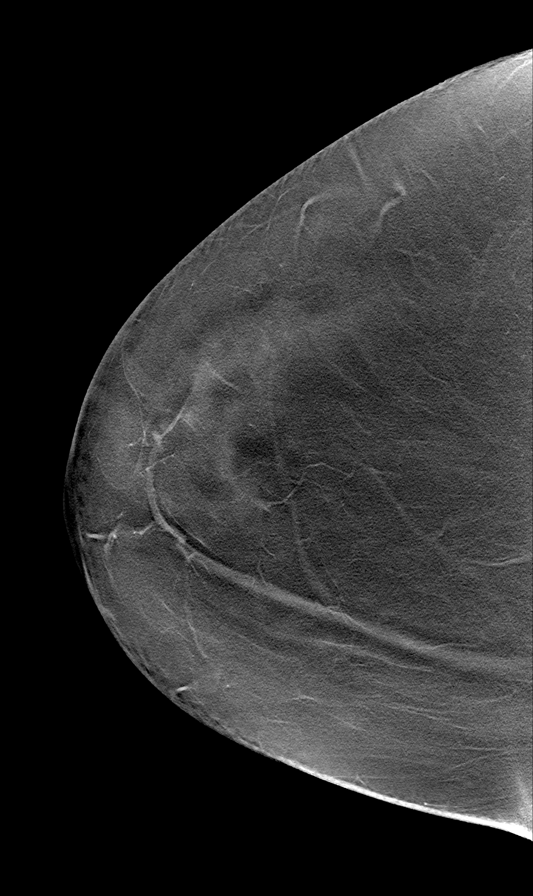

[L CC]
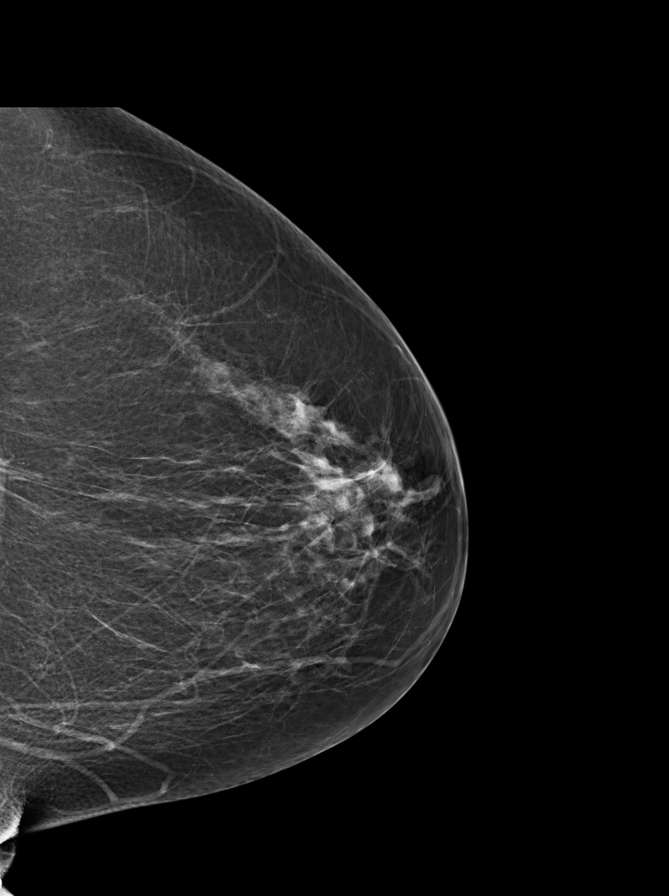

[BTO_TOMO L-CC PRIME, EMPIRE_C.97/112, Sc tomo · tomo slice 30/59.0]
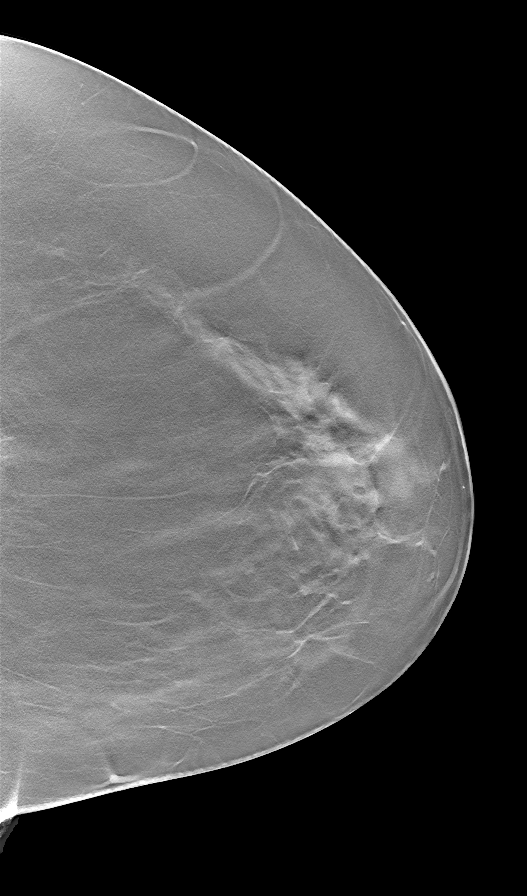

[R MLO]
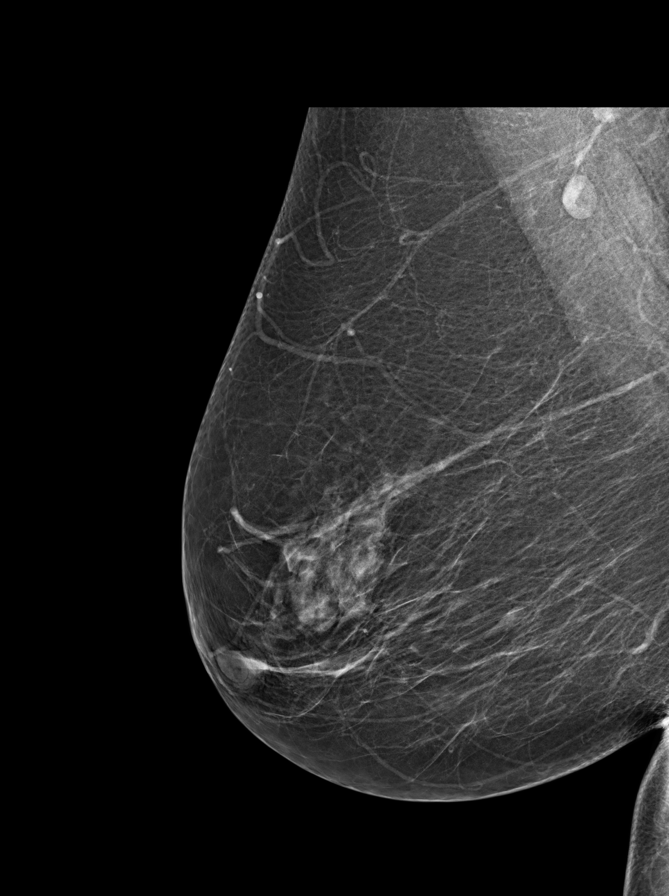

[BTO_TOMO R-MLO PRIME, EMPIRE_C.97/112, S tomo · tomo slice 32/63.0]
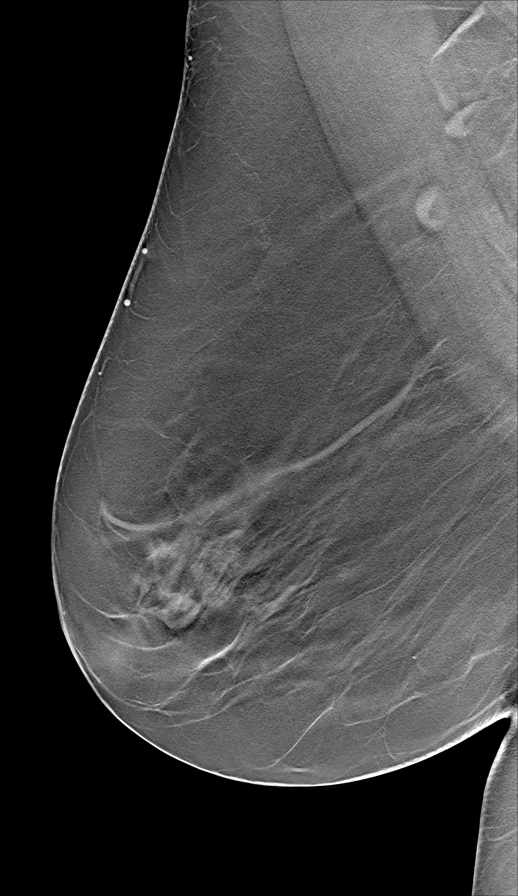

[L MLO]
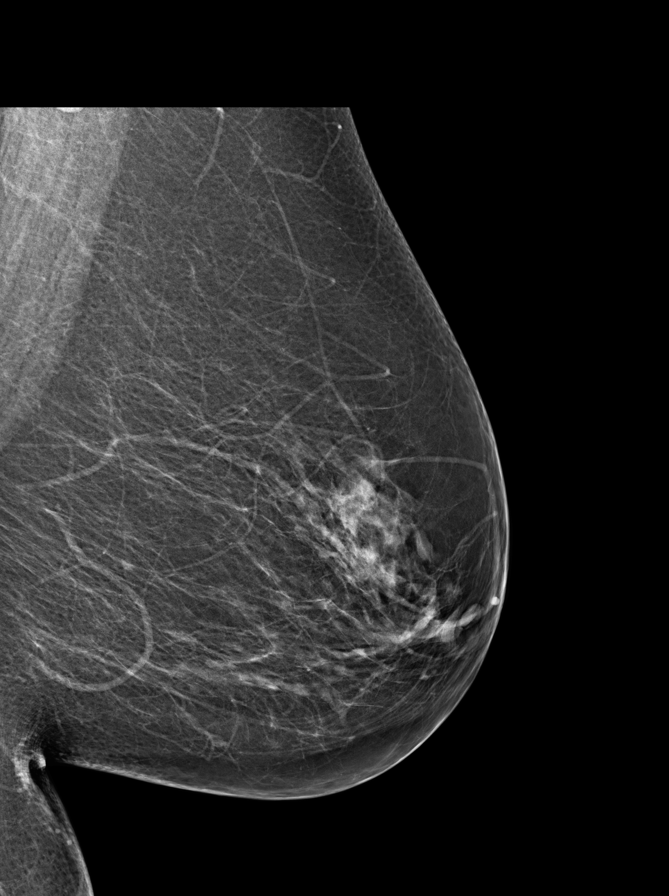

[BTO_TOMO L-MLO PRIME, EMPIRE_C.97/112, S tomo · tomo slice 31/60.0]
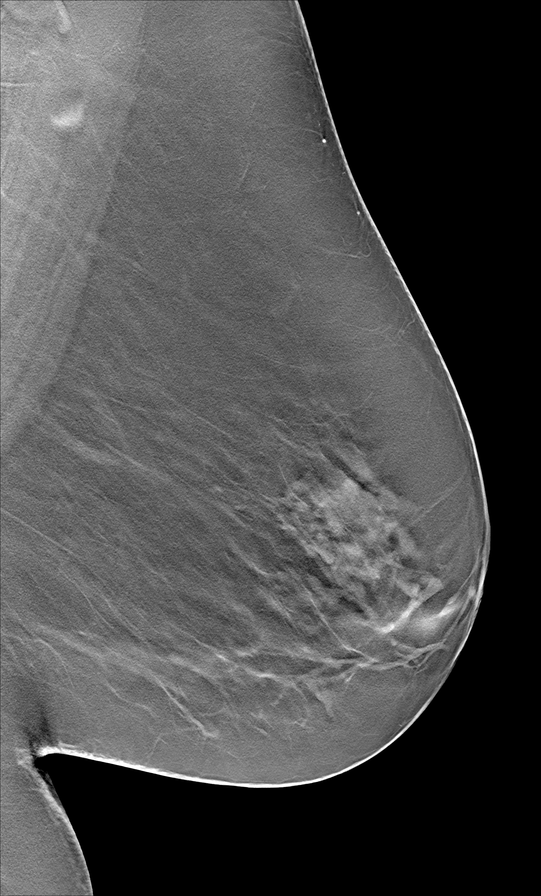

[10 of 24 positions shown; findings below may reference images not displayed]

EXAM

BILATERAL DIGITAL SCREENING MAMMOGRAM, CPT 29191; WITH CAD

INDICATION

TC SCORE: LIFETIME RISK 3.9%.  SCREENING MAMMOGRAM. AB (3D) PRIORS: [id number].

Digital 2D CC and MLO projections obtained with 3D tomographic views per manufacturer's protocol.

COMPARISONS

Previous mammogram dated 12/10/2020.

FINDINGS

The breasts are fatty replaced with scattered heterogeneous fibroglandular structures. There are no
suspicious spiculated masses. There are no suspicious clusters of micro calcifications. There is no
evidence of skin thickening or nipple retraction. There is no evidence of axillary lymphadenopathy.
Benign calcifications were identified.

IMPRESSION

No localizing signs of malignancy.

BI-RADS 1, NEGATIVE.

A reminder letter will be sent.

A twelve month screening mammogram is recommended and a follow-up letter will be scheduled.

Computer Aided Detection was utilized for this interpretation.

A. A negative x-ray report should not delay biopsy if a dominant or clinically suspicious mass is
present.

B. A small percentage (probably less than 10%) of cancers will not be identified by x-ray, and there
will be a similar small percentage of false positive reports.

C. Adenosis and dense breasts may obscure an underlying neoplasm.

Tech Notes:
